# Patient Record
Sex: Female | Born: 1985
Health system: Southern US, Community
[De-identification: ages and names within clinical notes are randomized; demographics above are authoritative.]

## PROBLEM LIST (undated history)

## (undated) DIAGNOSIS — Z789 Other specified health status: Secondary | ICD-10-CM

## (undated) HISTORY — PX: EYE SURGERY: SHX253

---

## 2014-09-28 ENCOUNTER — Telehealth: Payer: Self-pay | Admitting: *Deleted

## 2014-09-28 NOTE — Telephone Encounter (Signed)
Attempted to contact patient to schedule a new patient IUD removal. Left message for patient to contact the office.

## 2014-10-08 NOTE — Telephone Encounter (Signed)
[place] and has had it in for 2 years. Patient states she doesn't care for it. Patient is requesting to have it removed. Scheduled appointment for 11-04-14 @ 9:30

## 2014-11-04 ENCOUNTER — Ambulatory Visit: Payer: Self-pay | Admitting: Obstetrics & Gynecology

## 2014-11-16 ENCOUNTER — Encounter: Payer: Self-pay | Admitting: Obstetrics & Gynecology

## 2014-11-25 ENCOUNTER — Encounter: Payer: Self-pay | Admitting: Obstetrics

## 2014-11-25 ENCOUNTER — Ambulatory Visit (INDEPENDENT_AMBULATORY_CARE_PROVIDER_SITE_OTHER): Payer: Medicaid Other | Admitting: Obstetrics

## 2014-11-25 VITALS — BP 110/76 | HR 69 | Temp 98.6°F | Wt 126.0 lb

## 2014-11-25 DIAGNOSIS — A499 Bacterial infection, unspecified: Secondary | ICD-10-CM

## 2014-11-25 DIAGNOSIS — Z30432 Encounter for removal of intrauterine contraceptive device: Secondary | ICD-10-CM

## 2014-11-25 DIAGNOSIS — N760 Acute vaginitis: Secondary | ICD-10-CM

## 2014-11-25 DIAGNOSIS — Z3202 Encounter for pregnancy test, result negative: Secondary | ICD-10-CM

## 2014-11-25 DIAGNOSIS — Z30011 Encounter for initial prescription of contraceptive pills: Secondary | ICD-10-CM

## 2014-11-25 DIAGNOSIS — B9689 Other specified bacterial agents as the cause of diseases classified elsewhere: Secondary | ICD-10-CM

## 2014-11-25 DIAGNOSIS — Z01818 Encounter for other preprocedural examination: Secondary | ICD-10-CM

## 2014-11-25 LAB — POCT URINE PREGNANCY: PREG TEST UR: NEGATIVE

## 2014-11-25 MED ORDER — NORETHINDRONE-ETH ESTRADIOL 1-35 MG-MCG PO TABS: 1.0000 | ORAL_TABLET | Freq: Every day | ORAL | Status: DC

## 2014-11-25 MED ORDER — METRONIDAZOLE 0.75 % VA GEL: 1.0000 | Freq: Two times a day (BID) | VAGINAL | Status: DC

## 2014-11-26 NOTE — Progress Notes (Signed)
Subjective:    Sherri Christensen is a 29 y.o. female who presents for IUD removal. The patient has no complaints today. The patient is sexually active. Pertinent past medical history: none.  The information documented in the HPI was reviewed and verified.  Menstrual History: OB History    Gravida Para Term Preterm AB TAB SAB Ectopic Multiple Living   3 3 3               Patient's last menstrual period was 11/18/2014.   Patient Active Problem List   Diagnosis Date Noted  . BV (bacterial vaginosis) 11/25/2014   History reviewed. No pertinent past medical history.  Past Surgical History  Procedure Laterality Date  . Mouth surgery    . Eye surgery      lasik    Current outpatient prescriptions: metroNIDAZOLE (METROGEL VAGINAL) 0.75 % vaginal gel, Place 1 Applicatorful vaginally 2 (two) times daily., Disp: 70 g, Rfl: 0;  norethindrone-ethinyl estradiol 1/35 (ORTHO-NOVUM 1/35, 28,) tablet, Take 1 tablet by mouth daily., Disp: 1 Package, Rfl: 11 Not on File  History  Substance Use Topics  . Smoking status: Never Smoker   . Smokeless tobacco: Not on file  . Alcohol Use: No    Family History  Problem Relation Age of Onset  . Hypertension Mother   . Hypertension Maternal Grandmother        Review of Systems Constitutional: negative for weight loss Genitourinary:negative for abnormal menstrual periods and vaginal discharge  Objective:   BP 110/76 mmHg  Pulse 69  Temp(Src) 98.6 F (37 C)  Wt 126 lb (57.153 kg)  LMP 11/18/2014                  Abdomen:  normal findings: no organomegaly, soft, non-tender and no hernia  Pelvis:  External genitalia: normal general appearance Urinary system: urethral meatus normal and bladder without fullness, nontender Vaginal: normal without tenderness, induration or masses Cervix: normal appearance.  IUD string visible and grasped with dressing forceps, and the IUD removed, intact. Adnexa: normal bimanual exam Uterus: anteverted and  non-tender, normal size    Assessment:    29 y.o., discontinuing IUD, wants to start OCP's,  no contraindications.    Plan:   Ortho Novum 1/35 Rx   All questions answered.  Meds ordered this encounter  Medications  . norethindrone-ethinyl estradiol 1/35 (ORTHO-NOVUM 1/35, 28,) tablet    Sig: Take 1 tablet by mouth daily.    Dispense:  1 Package    Refill:  11  . metroNIDAZOLE (METROGEL VAGINAL) 0.75 % vaginal gel    Sig: Place 1 Applicatorful vaginally 2 (two) times daily.    Dispense:  70 g    Refill:  0   Orders Placed This Encounter  Procedures  . SureSwab, Vaginosis/Vaginitis Plus  . POCT urine pregnancy

## 2014-11-29 LAB — SURESWAB, VAGINOSIS/VAGINITIS PLUS
Atopobium vaginae: DETECTED Log (cells/mL)
C. ALBICANS, DNA: NOT DETECTED
C. GLABRATA, DNA: NOT DETECTED
C. TROPICALIS, DNA: NOT DETECTED
C. parapsilosis, DNA: NOT DETECTED
C. trachomatis RNA, TMA: NOT DETECTED
GARDNERELLA VAGINALIS: 7 Log (cells/mL)
LACTOBACILLUS SPECIES: NOT DETECTED Log (cells/mL)
MEGASPHAERA SPECIES: 7.9 Log (cells/mL)
N. GONORRHOEAE RNA, TMA: NOT DETECTED
T. vaginalis RNA, QL TMA: NOT DETECTED

## 2014-11-30 ENCOUNTER — Telehealth: Payer: Self-pay | Admitting: Obstetrics

## 2014-11-30 ENCOUNTER — Other Ambulatory Visit: Payer: Self-pay | Admitting: Obstetrics

## 2014-12-01 NOTE — Telephone Encounter (Signed)
Not seen by physician. 

## 2015-05-27 ENCOUNTER — Other Ambulatory Visit: Payer: Medicaid Other

## 2015-06-02 ENCOUNTER — Other Ambulatory Visit (INDEPENDENT_AMBULATORY_CARE_PROVIDER_SITE_OTHER): Payer: Medicaid Other

## 2015-06-02 ENCOUNTER — Encounter: Payer: Self-pay | Admitting: Obstetrics

## 2015-06-02 VITALS — BP 116/75 | HR 85 | Temp 99.0°F | Ht 71.0 in | Wt 130.0 lb

## 2015-06-02 DIAGNOSIS — N926 Irregular menstruation, unspecified: Secondary | ICD-10-CM | POA: Diagnosis not present

## 2015-06-02 LAB — POCT URINE PREGNANCY: Preg Test, Ur: POSITIVE — AB

## 2015-06-02 NOTE — Progress Notes (Signed)
Patient in office for a confirmation of pregnancy. Patient states she had a positive home pregnancy test. Pregnancy Test in office is positive. Patient encourage to take prenatal vitamins. Patient also encouraged to schedule a prenatal vitamins.   BP 116/75 mmHg  Pulse 85  Temp(Src) 99 F (37.2 C)  Ht 5\' 11"  (1.803 m)  Wt 130 lb (58.968 kg)  BMI 18.14 kg/m2  LMP 04/30/2015

## 2015-06-07 ENCOUNTER — Ambulatory Visit (INDEPENDENT_AMBULATORY_CARE_PROVIDER_SITE_OTHER): Payer: Medicaid Other | Admitting: Obstetrics

## 2015-06-07 ENCOUNTER — Encounter: Payer: Self-pay | Admitting: Obstetrics

## 2015-06-07 VITALS — BP 114/71 | HR 77 | Temp 99.2°F | Ht 71.0 in | Wt 130.0 lb

## 2015-06-07 DIAGNOSIS — O200 Threatened abortion: Secondary | ICD-10-CM | POA: Diagnosis not present

## 2015-06-07 NOTE — Addendum Note (Signed)
Addended by: Aishah Teffeteller D on: 06/07/2015 04:09 PM   Modules accepted: Orders  

## 2015-06-07 NOTE — Progress Notes (Signed)
Patient ID: Sherri Christensen, female   DOB: 1986/04/12, 29 y.o.   MRN: [id number]  Chief Complaint  Patient presents with  . Problem    Positive UPT 06/02/15. Vaginal Bleeding for 3 days and some cramping.    HPI Sherri Christensen is a 29 y.o. female.  Vaginal bleeding for past few days, light.  No cramping.  HPI  History reviewed. No pertinent past medical history.  Past Surgical History  Procedure Laterality Date  . Mouth surgery    . Eye surgery      lasik    Family History  Problem Relation Age of Onset  . Hypertension Mother   . Hypertension Maternal Grandmother     Social History History  Substance Use Topics  . Smoking status: Never Smoker   . Smokeless tobacco: Not on file  . Alcohol Use: No    No Known Allergies  No current outpatient prescriptions on file.   No current facility-administered medications for this visit.    Review of Systems Review of Systems Constitutional: negative for fatigue and weight loss Respiratory: negative for cough and wheezing Cardiovascular: negative for chest pain, fatigue and palpitations Gastrointestinal: negative for abdominal pain and change in bowel habits Genitourinary: vaginal bleeding Integument/breast: negative for nipple discharge Musculoskeletal:negative for myalgias Neurological: negative for gait problems and tremors Behavioral/Psych: negative for abusive relationship, depression Endocrine: negative for temperature intolerance     Blood pressure 114/71, pulse 77, temperature 99.2 F (37.3 C), height 5\' 11"  (1.803 m), weight 130 lb (58.968 kg), last menstrual period 04/30/2015.  Physical Exam Physical Exam General:   alert  Skin:   no rash or abnormalities  Lungs:   clear to auscultation bilaterally  Heart:   regular rate and rhythm, S1, S2 normal, no murmur, click, rub or gallop  Breasts:   normal without suspicious masses, skin or nipple changes or axillary nodes  Abdomen:  normal findings: no organomegaly, soft,  non-tender and no hernia  Pelvis:  External genitalia: normal general appearance Urinary system: urethral meatus normal and bladder without fullness, nontender Vaginal: normal without tenderness, induration or masses Cervix: normal appearance Adnexa: normal bimanual exam Uterus: anteverted and non-tender, normal size      Data Reviewed labs  Assessment     Threatened Abortion    Plan    Follow clinically.  NOB visit scheduled in 1 week.  No orders of the defined types were placed in this encounter.   No orders of the defined types were placed in this encounter.

## 2015-06-10 ENCOUNTER — Other Ambulatory Visit: Payer: Self-pay | Admitting: Obstetrics

## 2015-06-10 DIAGNOSIS — B373 Candidiasis of vulva and vagina: Secondary | ICD-10-CM

## 2015-06-10 DIAGNOSIS — N760 Acute vaginitis: Principal | ICD-10-CM

## 2015-06-10 DIAGNOSIS — B9689 Other specified bacterial agents as the cause of diseases classified elsewhere: Secondary | ICD-10-CM

## 2015-06-10 DIAGNOSIS — B3731 Acute candidiasis of vulva and vagina: Secondary | ICD-10-CM

## 2015-06-10 LAB — SURESWAB, VAGINOSIS/VAGINITIS PLUS
ATOPOBIUM VAGINAE: 7 Log (cells/mL)
C. PARAPSILOSIS, DNA: NOT DETECTED
C. TRACHOMATIS RNA, TMA: NOT DETECTED
C. TROPICALIS, DNA: NOT DETECTED
C. albicans, DNA: DETECTED — AB
C. glabrata, DNA: NOT DETECTED
Gardnerella vaginalis: >8 Log (cells/mL)
LACTOBACILLUS SPECIES: NOT DETECTED Log (cells/mL)
MEGASPHAERA SPECIES: >8 Log (cells/mL)
N. GONORRHOEAE RNA, TMA: NOT DETECTED
T. vaginalis RNA, QL TMA: NOT DETECTED

## 2015-06-10 MED ORDER — METRONIDAZOLE 500 MG PO TABS: 500.0000 mg | ORAL_TABLET | Freq: Two times a day (BID) | ORAL | Status: DC

## 2015-06-10 MED ORDER — FLUCONAZOLE 150 MG PO TABS: 150.0000 mg | ORAL_TABLET | Freq: Once | ORAL | Status: DC

## 2015-06-18 ENCOUNTER — Inpatient Hospital Stay (HOSPITAL_COMMUNITY)
Admission: AD | Admit: 2015-06-18 | Discharge: 2015-06-18 | Disposition: A | Discharge status: Home / Self Care | Payer: Medicaid Other | Source: Ambulatory Visit | Attending: Obstetrics | Admitting: Obstetrics

## 2015-06-18 ENCOUNTER — Inpatient Hospital Stay (HOSPITAL_COMMUNITY): Payer: Medicaid Other

## 2015-06-18 ENCOUNTER — Encounter (HOSPITAL_COMMUNITY): Payer: Self-pay | Admitting: *Deleted

## 2015-06-18 DIAGNOSIS — O468X1 Other antepartum hemorrhage, first trimester: Secondary | ICD-10-CM

## 2015-06-18 DIAGNOSIS — N760 Acute vaginitis: Secondary | ICD-10-CM | POA: Diagnosis not present

## 2015-06-18 DIAGNOSIS — R42 Dizziness and giddiness: Secondary | ICD-10-CM | POA: Insufficient documentation

## 2015-06-18 DIAGNOSIS — O23591 Infection of other part of genital tract in pregnancy, first trimester: Secondary | ICD-10-CM | POA: Diagnosis not present

## 2015-06-18 DIAGNOSIS — B9689 Other specified bacterial agents as the cause of diseases classified elsewhere: Secondary | ICD-10-CM | POA: Insufficient documentation

## 2015-06-18 DIAGNOSIS — O208 Other hemorrhage in early pregnancy: Secondary | ICD-10-CM | POA: Insufficient documentation

## 2015-06-18 DIAGNOSIS — O209 Hemorrhage in early pregnancy, unspecified: Secondary | ICD-10-CM | POA: Diagnosis present

## 2015-06-18 DIAGNOSIS — R1032 Left lower quadrant pain: Secondary | ICD-10-CM | POA: Diagnosis not present

## 2015-06-18 DIAGNOSIS — Z3A01 Less than 8 weeks gestation of pregnancy: Secondary | ICD-10-CM | POA: Diagnosis not present

## 2015-06-18 DIAGNOSIS — O418X10 Other specified disorders of amniotic fluid and membranes, first trimester, not applicable or unspecified: Secondary | ICD-10-CM

## 2015-06-18 DIAGNOSIS — O4691 Antepartum hemorrhage, unspecified, first trimester: Secondary | ICD-10-CM

## 2015-06-18 LAB — WET PREP, GENITAL
TRICH WET PREP: NONE SEEN
YEAST WET PREP: NONE SEEN

## 2015-06-18 LAB — CBC
HEMATOCRIT: 35.3 % — AB (ref 36.0–46.0)
HEMOGLOBIN: 12 g/dL (ref 12.0–15.0)
MCH: 26.2 pg (ref 26.0–34.0)
MCHC: 34 g/dL (ref 30.0–36.0)
MCV: 77.1 fL — ABNORMAL LOW (ref 78.0–100.0)
Platelets: 179 10*3/uL (ref 150–400)
RBC: 4.58 MIL/uL (ref 3.87–5.11)
RDW: 13.4 % (ref 11.5–15.5)
WBC: 11.7 10*3/uL — ABNORMAL HIGH (ref 4.0–10.5)

## 2015-06-18 LAB — URINE MICROSCOPIC-ADD ON

## 2015-06-18 LAB — URINALYSIS, ROUTINE W REFLEX MICROSCOPIC
BILIRUBIN URINE: NEGATIVE
Glucose, UA: NEGATIVE mg/dL
KETONES UR: NEGATIVE mg/dL
Leukocytes, UA: NEGATIVE
Nitrite: NEGATIVE
PROTEIN: NEGATIVE mg/dL
Specific Gravity, Urine: 1.025 (ref 1.005–1.030)
UROBILINOGEN UA: 0.2 mg/dL (ref 0.0–1.0)
pH: 5.5 (ref 5.0–8.0)

## 2015-06-18 LAB — ABO/RH: ABO/RH(D): O POS

## 2015-06-18 LAB — HCG, QUANTITATIVE, PREGNANCY: hCG, Beta Chain, Quant, S: 107701 m[IU]/mL — ABNORMAL HIGH (ref <5)

## 2015-06-18 MED ORDER — METRONIDAZOLE 500 MG PO TABS: 500.0000 mg | ORAL_TABLET | Freq: Two times a day (BID) | ORAL | Status: DC

## 2015-06-18 NOTE — MAU Note (Signed)
Pt presents to MAU with complaints of heavy vaginal bleeding, dizziness,and lower abdominal cramping

## 2015-06-18 NOTE — Discharge Instructions (Signed)
Bacterial Vaginosis °Bacterial vaginosis is a vaginal infection that occurs when the normal balance of bacteria in the vagina is disrupted. It results from an overgrowth of certain bacteria. This is the most common vaginal infection in women of childbearing age. Treatment is important to prevent complications, especially in pregnant women, as it can cause a premature delivery. °CAUSES  °Bacterial vaginosis is caused by an increase in harmful bacteria that are normally present in smaller amounts in the vagina. Several different kinds of bacteria can cause bacterial vaginosis. However, the reason that the condition develops is not fully understood. °RISK FACTORS °Certain activities or behaviors can put you at an increased risk of developing bacterial vaginosis, including: °· Having a new sex partner or multiple sex partners. °· Douching. °· Using an intrauterine device (IUD) for contraception. °Women do not get bacterial vaginosis from toilet seats, bedding, swimming pools, or contact with objects around them. °SIGNS AND SYMPTOMS  °Some women with bacterial vaginosis have no signs or symptoms. Common symptoms include: °· Grey vaginal discharge. °· A fishlike odor with discharge, especially after sexual intercourse. °· Itching or burning of the vagina and vulva. °· Burning or pain with urination. °DIAGNOSIS  °Your health care provider will take a medical history and examine the vagina for signs of bacterial vaginosis. A sample of vaginal fluid may be taken. Your health care provider will look at this sample under a microscope to check for bacteria and abnormal cells. A vaginal pH test may also be done.  °TREATMENT  °Bacterial vaginosis may be treated with antibiotic medicines. These may be given in the form of a pill or a vaginal cream. A second round of antibiotics may be prescribed if the condition comes back after treatment.  °HOME CARE INSTRUCTIONS  °· Only take over-the-counter or prescription medicines as  directed by your health care provider. °· If antibiotic medicine was prescribed, take it as directed. Make sure you finish it even if you start to feel better. °· Do not have sex until treatment is completed. °· Tell all sexual partners that you have a vaginal infection. They should see their health care provider and be treated if they have problems, such as a mild rash or itching. °· Practice safe sex by using condoms and only having one sex partner. °SEEK MEDICAL CARE IF:  °· Your symptoms are not improving after 3 days of treatment. °· You have increased discharge or pain. °· You have a fever. °MAKE SURE YOU:  °· Understand these instructions. °· Will watch your condition. °· Will get help right away if you are not doing well or get worse. °FOR MORE INFORMATION  °Centers for Disease Control and Prevention, Division of STD Prevention: www.cdc.gov/std °American Sexual Health Association (ASHA): www.ashastd.org  °Document Released: 11/05/2005 Document Revised: 08/26/2013 Document Reviewed: 06/17/2013 °ExitCare® Patient Information ©2015 [employer]. This information is not intended to replace advice given to you by your health care provider. Make sure you discuss any questions you have with your health care provider. ° °Subchorionic Hematoma °A subchorionic hematoma is a gathering of blood between the outer wall of the placenta and the inner wall of the womb (uterus). The placenta is the organ that connects the fetus to the wall of the uterus. The placenta performs the feeding, breathing (oxygen to the fetus), and waste removal (excretory work) of the fetus.  °Subchorionic hematoma is the most common abnormality found on a result from ultrasonography done during the first trimester or early second trimester of pregnancy. If   there has been little or no vaginal bleeding, early small hematomas usually shrink on their own and do not affect your baby or pregnancy. The blood is gradually absorbed over 1-2 weeks. When  bleeding starts later in pregnancy or the hematoma is larger or occurs in an older pregnant woman, the outcome may not be as good. Larger hematomas may get bigger, which increases the chances for miscarriage. Subchorionic hematoma also increases the risk of premature detachment of the placenta from the uterus, preterm (premature) labor, and stillbirth. °HOME CARE INSTRUCTIONS °· Stay on bed rest if your health care provider recommends this. Although bed rest will not prevent more bleeding or prevent a miscarriage, your health care provider may recommend bed rest until you are advised otherwise. °· Avoid heavy lifting (more than 10 lb [4.5 kg]), exercise, sexual intercourse, or douching as directed by your health care provider. °· Keep track of the number of pads you use each day and how soaked (saturated) they are. Write down this information. °· Do not use tampons. °· Keep all follow-up appointments as directed by your health care provider. Your health care provider may ask you to have follow-up blood tests or ultrasound tests or both. °SEEK IMMEDIATE MEDICAL CARE IF: °· You have severe cramps in your stomach, back, abdomen, or pelvis. °· You have a fever. °· You pass large clots or tissue. Save any tissue for your health care provider to look at. °· Your bleeding increases or you become lightheaded, feel weak, or have fainting episodes. °Document Released: 02/20/2007 Document Revised: 03/22/2014 Document Reviewed: 06/04/2013 °ExitCare® Patient Information ©2015 [employer]. This information is not intended to replace advice given to you by your health care provider. Make sure you discuss any questions you have with your health care provider. ° °

## 2015-06-18 NOTE — MAU Provider Note (Signed)
History     CSN: [id number]  Arrival date and time: 06/18/15 1355   First Provider Initiated Contact with Patient 06/18/15 1526      Chief Complaint  Patient presents with  . Dizziness  . Vaginal Bleeding  . Abdominal Cramping   HPI Ms. Sherri Christensen is a 29 y.o. G4P3000 at [redacted]w[redacted]d who presents to MAU today with complaint of vaginal bleeding and lower abdominal pain. The patient states that bleeding was heavier earlier, but upon arrival in MAU she states there was no blood noted with wiping. She denies vaginal discharge, N/V/D, UTI symptoms or fever. She states LLQ abdominal pain is mild and intermittent. She states that bleeding started today. She also endorses dizziness. LMP was 04/30/15.   OB History    Gravida Para Term Preterm AB TAB SAB Ectopic Multiple Living   4 3 3              History reviewed. No pertinent past medical history.  Past Surgical History  Procedure Laterality Date  . Mouth surgery    . Eye surgery      lasik    Family History  Problem Relation Age of Onset  . Hypertension Mother   . Hypertension Maternal Grandmother     History  Substance Use Topics  . Smoking status: Never Smoker   . Smokeless tobacco: Not on file  . Alcohol Use: No    Allergies: No Known Allergies  Prescriptions prior to admission  Medication Sig Dispense Refill Last Dose  . Prenatal Vit-Min-FA-Fish Oil ([facility] PRENATAL GUMMY PO) Take 2 each by mouth daily.   06/17/2015 at Unknown time  . fluconazole (DIFLUCAN) 150 MG tablet Take 1 tablet (150 mg total) by mouth once. (Patient not taking: Reported on 06/18/2015) 1 tablet 2   . metroNIDAZOLE (FLAGYL) 500 MG tablet Take 1 tablet (500 mg total) by mouth 2 (two) times daily. (Patient not taking: Reported on 06/18/2015) 14 tablet 2     Review of Systems  Constitutional: Negative for fever and malaise/fatigue.  Gastrointestinal: Positive for abdominal pain. Negative for nausea, vomiting, diarrhea and constipation.  Genitourinary:  Negative for dysuria, urgency and frequency.       + vaginal bleeding Neg - vaginal discharge   Physical Exam   Blood pressure 110/65, pulse 72, temperature 98.2 F (36.8 C), resp. rate 18, weight 130 lb (58.968 kg), last menstrual period 04/30/2015.  Physical Exam  Nursing note and vitals reviewed. Constitutional: She is oriented to person, place, and time. She appears well-developed and well-nourished. No distress.  HENT:  Head: Normocephalic and atraumatic.  Cardiovascular: Normal rate.   Respiratory: Effort normal.  GI: Soft. She exhibits no distension and no mass. There is tenderness (very mild LLQ abdominal tenderness to palpation). There is no rebound and no guarding.  Neurological: She is alert and oriented to person, place, and time.  Skin: Skin is warm and dry. No erythema.  Psychiatric: She has a normal mood and affect.    Results for orders placed or performed during the hospital encounter of 06/18/15 (from the past 24 hour(s))  CBC     Status: Abnormal   Collection Time: 06/18/15  2:15 PM  Result Value Ref Range   WBC 11.7 (H) 4.0 - 10.5 K/uL   RBC 4.58 3.87 - 5.11 MIL/uL   Hemoglobin 12.0 12.0 - 15.0 g/dL   HCT 40.9 (L) 81.1 - 91.4 %   MCV 77.1 (L) 78.0 - 100.0 fL   MCH 26.2 26.0 - 34.0  pg   MCHC 34.0 30.0 - 36.0 g/dL   RDW 09.8 11.9 - 14.7 %   Platelets 179 150 - 400 K/uL  ABO/Rh     Status: None (Preliminary result)   Collection Time: 06/18/15  2:15 PM  Result Value Ref Range   ABO/RH(D) O POS   hCG, quantitative, pregnancy     Status: Abnormal   Collection Time: 06/18/15  2:15 PM  Result Value Ref Range   hCG, Beta Chain, Quant, S 107701 (H) <5 mIU/mL  Urinalysis, Routine w reflex microscopic (not at [facility])     Status: Abnormal   Collection Time: 06/18/15  3:00 PM  Result Value Ref Range   Color, Urine YELLOW YELLOW   APPearance CLEAR CLEAR   Specific Gravity, Urine 1.025 1.005 - 1.030   pH 5.5 5.0 - 8.0   Glucose, UA NEGATIVE NEGATIVE mg/dL   Hgb  urine dipstick TRACE (A) NEGATIVE   Bilirubin Urine NEGATIVE NEGATIVE   Ketones, ur NEGATIVE NEGATIVE mg/dL   Protein, ur NEGATIVE NEGATIVE mg/dL   Urobilinogen, UA 0.2 0.0 - 1.0 mg/dL   Nitrite NEGATIVE NEGATIVE   Leukocytes, UA NEGATIVE NEGATIVE  Urine microscopic-add on     Status: Abnormal   Collection Time: 06/18/15  3:00 PM  Result Value Ref Range   Squamous Epithelial / LPF RARE RARE   WBC, UA 0-2 <3 WBC/hpf   RBC / HPF 0-2 <3 RBC/hpf   Bacteria, UA FEW (A) RARE   Urine-Other MUCOUS PRESENT   Wet prep, genital     Status: Abnormal   Collection Time: 06/18/15  4:15 PM  Result Value Ref Range   Yeast Wet Prep HPF POC NONE SEEN NONE SEEN   Trich, Wet Prep NONE SEEN NONE SEEN   Clue Cells Wet Prep HPF POC MODERATE (A) NONE SEEN   WBC, Wet Prep HPF POC MANY (A) NONE SEEN   US Ob Comp Less 14 Wks  06/18/2015   CLINICAL DATA:  Vaginal bleeding  EXAM: OBSTETRIC <14 WK [place] AND TRANSVAGINAL OB US  TECHNIQUE: Both transabdominal and transvaginal ultrasound examinations were performed for complete evaluation of the gestation as well as the maternal uterus, adnexal regions, and pelvic cul-de-sac. Transvaginal technique was performed to assess early pregnancy.  COMPARISON:  None.  FINDINGS: Intrauterine gestational sac: Visualized/normal in shape.  Yolk sac:  Visualized  Embryo:  Visualized  Cardiac Activity: Visualized  Heart Rate: 144  bpm  MSD:   mm    w     d  CRL:  10.8  mm   7 w   1 d                  [place] EDC: 02/03/2016  Maternal uterus/adnexae: Small subchorionic hemorrhage. No adnexal masses. Trace free fluid in the pelvis.  IMPRESSION: Seven week 1 day intrauterine pregnancy. No acute maternal findings.   Electronically Signed   By: Charlett Nose M.D.   On: 06/18/2015 16:11   US Ob Transvaginal  06/18/2015   CLINICAL DATA:  Vaginal bleeding  EXAM: OBSTETRIC <14 WK [place] AND TRANSVAGINAL OB US  TECHNIQUE: Both transabdominal and transvaginal ultrasound examinations were performed for complete  evaluation of the gestation as well as the maternal uterus, adnexal regions, and pelvic cul-de-sac. Transvaginal technique was performed to assess early pregnancy.  COMPARISON:  None.  FINDINGS: Intrauterine gestational sac: Visualized/normal in shape.  Yolk sac:  Visualized  Embryo:  Visualized  Cardiac Activity: Visualized  Heart Rate: 144  bpm  MSD:   mm    w     d  CRL:  10.8  mm   7 w   1 d                  US EDC: 03/17/20[place]  Maternal uterus/adnexae: Small subchorionic hemorrhage. No adnexal masses. Trace free fluid in the pelvis.  IMPRESSION: Seven week 1 day intrauterine pregnancy. No acute maternal findings.   Electronically Signed   By: Charlett Nose M.D.   On: 06/18/2015 16:11    MAU Course  Procedures None  MDM +UPT UA, wet prep, GC/chlamydia, CBC, ABO/Rh, quant hCG, HIV, RPR and [place] today to rule out ectopic pregnancy  Assessment and Plan  A: SIUP at [redacted]w[redacted]d Small subchorionic hemorrhage Bacterial vaginosis  P: Discharge home Rx for Flagyl given to patient  Bleeding precautions discussed Pelvic rest advised Patient advised to follow-up with Dr. Clearance Coots as scheduled for routine prenatal care Patient may return to MAU as needed or if her condition were to change or worsen   Marny Lowenstein, PA-C  06/18/2015, 4:43 PM

## 2015-06-19 LAB — HIV ANTIBODY (ROUTINE TESTING W REFLEX): HIV Screen 4th Generation wRfx: NONREACTIVE

## 2015-06-20 LAB — GC/CHLAMYDIA PROBE AMP (~~LOC~~) NOT AT ARMC
CHLAMYDIA, DNA PROBE: NEGATIVE
NEISSERIA GONORRHEA: NEGATIVE

## 2015-07-01 ENCOUNTER — Inpatient Hospital Stay (HOSPITAL_COMMUNITY)
Admission: AD | Admit: 2015-07-01 | Discharge: 2015-07-01 | Disposition: A | Discharge status: Home / Self Care | Payer: Medicaid Other | Source: Ambulatory Visit | Attending: Obstetrics | Admitting: Obstetrics

## 2015-07-01 ENCOUNTER — Inpatient Hospital Stay (HOSPITAL_COMMUNITY): Payer: Medicaid Other

## 2015-07-01 ENCOUNTER — Encounter (HOSPITAL_COMMUNITY): Payer: Self-pay | Admitting: *Deleted

## 2015-07-01 DIAGNOSIS — O200 Threatened abortion: Secondary | ICD-10-CM | POA: Insufficient documentation

## 2015-07-01 DIAGNOSIS — O209 Hemorrhage in early pregnancy, unspecified: Secondary | ICD-10-CM | POA: Diagnosis not present

## 2015-07-01 DIAGNOSIS — O468X1 Other antepartum hemorrhage, first trimester: Secondary | ICD-10-CM | POA: Diagnosis not present

## 2015-07-01 DIAGNOSIS — O418X10 Other specified disorders of amniotic fluid and membranes, first trimester, not applicable or unspecified: Secondary | ICD-10-CM

## 2015-07-01 HISTORY — DX: Other specified health status: Z78.9

## 2015-07-01 LAB — CBC
HCT: 32.6 % — ABNORMAL LOW (ref 36.0–46.0)
HEMOGLOBIN: 11 g/dL — AB (ref 12.0–15.0)
MCH: 25.9 pg — ABNORMAL LOW (ref 26.0–34.0)
MCHC: 33.7 g/dL (ref 30.0–36.0)
MCV: 76.9 fL — ABNORMAL LOW (ref 78.0–100.0)
Platelets: 166 10*3/uL (ref 150–400)
RBC: 4.24 MIL/uL (ref 3.87–5.11)
RDW: 13.6 % (ref 11.5–15.5)
WBC: 9.4 10*3/uL (ref 4.0–10.5)

## 2015-07-01 NOTE — MAU Note (Signed)
Started bleeding prior to arrival, when arrived, had soaked pad and bled through clothes.  Passed a large clot while in bathroom.  Denies any pain.  Prior episode of bleeding was just spotting

## 2015-07-01 NOTE — MAU Note (Signed)
Assumed care of patient.

## 2015-07-01 NOTE — MAU Provider Note (Signed)
History     CSN: [id number]  Arrival date and time: 07/01/15 1457   First Provider Initiated Contact with Patient 07/01/15 1536      Chief Complaint  Patient presents with  . Vaginal Bleeding   HPI Sherri Christensen 29 y.o.   presents to MAU complaining of vaginal bleeding.  At 2:30, she felt like she peed on herself and she went to bathroom.  She notes her clothes were soaked.  She continues to note bleeding now. It is dark red.  She has abdominal pain, 2/10.  She had nausea earlier today but none today.  She denies weakness, syncope, fever, dysuria.   OB History    Gravida Para Term Preterm AB TAB SAB Ectopic Multiple Living   Past Medical History  Diagnosis Date  . Medical history non-contributory     Past Surgical History  Procedure Laterality Date  . Eye surgery      lasik    Family History  Problem Relation Age of Onset  . Hypertension Mother   . Hypertension Maternal Grandmother     Social History  Substance Use Topics  . Smoking status: Never Smoker   . Smokeless tobacco: None  . Alcohol Use: No    Allergies: No Known Allergies  Prescriptions prior to admission  Medication Sig Dispense Refill Last Dose  . Prenatal Vit-Min-FA-Fish Oil ([facility] PRENATAL GUMMY PO) Take 2 each by mouth daily.   07/01/2015 at Unknown time  . metroNIDAZOLE (FLAGYL) 500 MG tablet Take 1 tablet (500 mg total) by mouth 2 (two) times daily. 14 tablet 0 Unknown at Unknown time    ROS Pertinent ROS in HPI.  All other systems are negative.   Physical Exam   Blood pressure 109/74, pulse 78, temperature 98.8 F (37.1 C), temperature source Oral, resp. rate 18, last menstrual period 04/30/2015.  Physical Exam  Constitutional: She is oriented to person, place, and time. She appears well-developed and well-nourished. No distress.  HENT:  Head: Normocephalic and atraumatic.  Eyes: EOM are normal.  Neck: Normal range of motion.  Cardiovascular: Normal rate.    Respiratory: Effort normal. No respiratory distress.  GI: Soft. There is no tenderness.  Genitourinary:  Mod amt of thin red blood.  No visible clots or products of conception noted.  Cervix is closed.   No CMT.  No adnexal mass or tenderness  Musculoskeletal: Normal range of motion.  Neurological: She is alert and oriented to person, place, and time.  Skin: Skin is warm and dry.  Psychiatric: She has a normal mood and affect.   Results for orders placed or performed during the hospital encounter of 07/01/15 (from the past 24 hour(s))  CBC     Status: Abnormal   Collection Time: 07/01/15  3:54 PM  Result Value Ref Range   WBC 9.4 4.0 - 10.5 K/uL   RBC 4.24 3.87 - 5.11 MIL/uL   Hemoglobin 11.0 (L) 12.0 - 15.0 g/dL   HCT 78.2 (L) 95.6 - 21.3 %   MCV 76.9 (L) 78.0 - 100.0 fL   MCH 25.9 (L) 26.0 - 34.0 pg   MCHC 33.7 30.0 - 36.0 g/dL   RDW 08.6 57.8 - 46.9 %   Platelets 166 150 - 400 K/uL   US Ob Transvaginal  07/01/2015   CLINICAL DATA:  Pregnant, vaginal bleeding/clots  EXAM: TRANSVAGINAL OB ULTRASOUND  TECHNIQUE: Transvaginal ultrasound was performed for complete evaluation of  the gestation as well as the maternal uterus, adnexal regions, and pelvic cul-de-sac.  COMPARISON:  06/18/2015  FINDINGS: Intrauterine gestational sac: Visualized/normal in shape.  Yolk sac:  Present  Embryo:  Present  Cardiac Activity: Present  Heart Rate: 160 bpm  CRL:   19.8  mm   8 w 4 d                  [place] EDC: 02/06/2015  Maternal uterus/adnexae: Large subchronic hemorrhage, measuring 7.9 x 3.6 x 4.8 cm.  Bilateral ovaries are not discretely visualized.  No free fluid.  IMPRESSION: Single live intrauterine gestation with estimated gestational age measuring 8 weeks 4 days by crown lump length.  Large subchronic hemorrhage.   Electronically Signed   By: Charline Bills M.D.   On: 07/01/2015 16:30   MAU Course  Procedures  MDM Discussed with Dr. Clearance Coots.  Pt may be discharged to home on pelvic  rest.  Assessment and Plan  A:  1. Subchorionic hemorrhage in first trimester   2. Vaginal bleeding in pregnancy, first trimester   3. Threatened abortion in early pregnancy    P: Discharge to home Continue PNV qd Pelvic rest F/u in office Precautions discussed Patient may return to MAU as needed or if her condition were to change or worsen   Bertram Denver 07/01/2015, 3:37 PM

## 2015-07-01 NOTE — Discharge Instructions (Signed)
Pelvic Rest Pelvic rest is sometimes recommended for women when:   The placenta is partially or completely covering the opening of the cervix (placenta previa).  There is bleeding between the uterine wall and the amniotic sac in the first trimester (subchorionic hemorrhage).  The cervix begins to open without labor starting (incompetent cervix, cervical insufficiency).  The labor is too early (preterm labor). HOME CARE INSTRUCTIONS  Do not have sexual intercourse, stimulation, or an orgasm.  Do not use tampons, douche, or put anything in the vagina.  Do not lift anything over 10 pounds (4.5 kg).  Avoid strenuous activity or straining your pelvic muscles. SEEK MEDICAL CARE IF:  You have any vaginal bleeding during pregnancy. Treat this as a potential emergency.  You have cramping pain felt low in the stomach (stronger than menstrual cramps).  You notice vaginal discharge (watery, mucus, or bloody).  You have a low, dull backache.  There are regular contractions or uterine tightening. SEEK IMMEDIATE MEDICAL CARE IF: You have vaginal bleeding and have placenta previa.  Document Released: 03/02/2011 Document Revised: 01/28/2012 Document Reviewed: 03/02/2011 [facility] Patient Information 2015 [place], [place]. This information is not intended to replace advice given to you by your health care provider. Make sure you discuss any questions you have with your health care provider.  Subchorionic Hematoma A subchorionic hematoma is a gathering of blood between the outer wall of the placenta and the inner wall of the womb (uterus). The placenta is the organ that connects the fetus to the wall of the uterus. The placenta performs the feeding, breathing (oxygen to the fetus), and waste removal (excretory work) of the fetus.  Subchorionic hematoma is the most common abnormality found on a result from ultrasonography done during the first trimester or early second trimester of pregnancy. If  there has been little or no vaginal bleeding, early small hematomas usually shrink on their own and do not affect your baby or pregnancy. The blood is gradually absorbed over 1-2 weeks. When bleeding starts later in pregnancy or the hematoma is larger or occurs in an older pregnant woman, the outcome may not be as good. Larger hematomas may get bigger, which increases the chances for miscarriage. Subchorionic hematoma also increases the risk of premature detachment of the placenta from the uterus, preterm (premature) labor, and stillbirth. HOME CARE INSTRUCTIONS  Stay on bed rest if your health care provider recommends this. Although bed rest will not prevent more bleeding or prevent a miscarriage, your health care provider may recommend bed rest until you are advised otherwise.  Avoid heavy lifting (more than 10 lb [4.5 kg]), exercise, sexual intercourse, or douching as directed by your health care provider.  Keep track of the number of pads you use each day and how soaked (saturated) they are. Write down this information.  Do not use tampons.  Keep all follow-up appointments as directed by your health care provider. Your health care provider may ask you to have follow-up blood tests or ultrasound tests or both. SEEK IMMEDIATE MEDICAL CARE IF:  You have severe cramps in your stomach, back, abdomen, or pelvis.  You have a fever.  You pass large clots or tissue. Save any tissue for your health care provider to look at.  Your bleeding increases or you become lightheaded, feel weak, or have fainting episodes. Document Released: 02/20/2007 Document Revised: 03/22/2014 Document Reviewed: 06/04/2013 [facility] Patient Information 2015 [place], [place]. This information is not intended to replace advice given to you by your health care provider.  Make sure you discuss any questions you have with your health care provider.  Threatened Miscarriage A threatened miscarriage is when you have vaginal  bleeding during your first 20 weeks of pregnancy but the pregnancy has not ended. Your doctor will do tests to make sure you are still pregnant. The cause of the bleeding may not be known. This condition does not mean your pregnancy will end. It does increase the risk of it ending (complete miscarriage). HOME CARE   Make sure you keep all your doctor visits for prenatal care.  Get plenty of rest.  Do not have sex or use tampons if you have vaginal bleeding.  Do not douche.  Do not smoke or use drugs.  Do not drink alcohol.  Avoid caffeine. GET HELP IF:  You have light bleeding from your vagina.  You have belly pain or cramping.  You have a fever. GET HELP RIGHT AWAY IF:   You have heavy bleeding from your vagina.  You have clots of blood coming from your vagina.  You have bad pain or cramps in your low back or belly.  You have fever, chills, and bad belly pain. MAKE SURE YOU:   Understand these instructions.  Will watch your condition.  Will get help right away if you are not doing well or get worse. Document Released: 10/18/2008 Document Revised: 11/10/2013 Document Reviewed: 09/01/2013 [facility] Patient Information 2015 [place], [place]. This information is not intended to replace advice given to you by your health care provider. Make sure you discuss any questions you have with your health care provider.

## 2015-07-05 ENCOUNTER — Encounter: Payer: Medicaid Other | Admitting: Obstetrics

## 2015-07-14 ENCOUNTER — Ambulatory Visit (INDEPENDENT_AMBULATORY_CARE_PROVIDER_SITE_OTHER): Payer: Medicaid Other | Admitting: Obstetrics

## 2015-07-14 ENCOUNTER — Encounter: Payer: Self-pay | Admitting: Obstetrics

## 2015-07-14 VITALS — BP 103/65 | HR 84 | Temp 98.7°F | Wt 131.0 lb

## 2015-07-14 DIAGNOSIS — O2690 Pregnancy related conditions, unspecified, unspecified trimester: Secondary | ICD-10-CM | POA: Diagnosis not present

## 2015-07-14 DIAGNOSIS — Z3481 Encounter for supervision of other normal pregnancy, first trimester: Secondary | ICD-10-CM | POA: Diagnosis not present

## 2015-07-14 LAB — POCT URINALYSIS DIPSTICK
BILIRUBIN UA: NEGATIVE
Blood, UA: NEGATIVE
Glucose, UA: NEGATIVE
KETONES UA: NEGATIVE
Leukocytes, UA: NEGATIVE
Nitrite, UA: NEGATIVE
PH UA: 5
Protein, UA: NEGATIVE
SPEC GRAV UA: 1.01
Urobilinogen, UA: NEGATIVE

## 2015-07-14 NOTE — Progress Notes (Signed)
Subjective:    Sherri Christensen is being seen today for her first obstetrical visit.  This is not a planned pregnancy. She is at [redacted]w[redacted]d gestation. Her obstetrical history is significant for n/a. Relationship with FOB: significant other, living together. Patient does intend to breast feed. Pregnancy history fully reviewed.  The information documented in the HPI was reviewed and verified.  Menstrual History: OB History    Gravida Para Term Preterm AB TAB SAB Ectopic Multiple Living   0 0 0 0 0 0 3       Patient's last menstrual period was 04/30/2015.    Past Medical History  Diagnosis Date  . Medical history non-contributory     Past Surgical History  Procedure Laterality Date  . Eye surgery      lasik     (Not in a hospital admission) No Known Allergies  Social History  Substance Use Topics  . Smoking status: Never Smoker   . Smokeless tobacco: Never Used  . Alcohol Use: No    Family History  Problem Relation Age of Onset  . Hypertension Mother   . Hypertension Maternal Grandmother      Review of Systems Constitutional: negative for weight loss Gastrointestinal: negative for vomiting Genitourinary:negative for genital lesions and vaginal discharge and dysuria Musculoskeletal:negative for back pain Behavioral/Psych: negative for abusive relationship, depression, illegal drug usage and tobacco use    Objective:    BP 103/65 mmHg  Pulse 84  Temp(Src) 98.7 F (37.1 C)  Wt 131 lb (59.421 kg)  LMP 04/30/2015 General Appearance:    Alert, cooperative, no distress, appears stated age  Head:    Normocephalic, without obvious abnormality, atraumatic  Eyes:    PERRL, conjunctiva/corneas clear, EOM's intact, fundi    benign, both eyes  Ears:    Normal TM's and external ear canals, both ears  Nose:   Nares normal, septum midline, mucosa normal, no drainage    or sinus tenderness  Throat:   Lips, mucosa, and tongue normal; teeth and gums normal  Neck:   Supple,  symmetrical, trachea midline, no adenopathy;    thyroid:  no enlargement/tenderness/nodules; no carotid   bruit or JVD  Back:     Symmetric, no curvature, ROM normal, no CVA tenderness  Lungs:     Clear to auscultation bilaterally, respirations unlabored  Chest Wall:    No tenderness or deformity   Heart:    Regular rate and rhythm, S1 and S2 normal, no murmur, rub   or gallop  Breast Exam:    No tenderness, masses, or nipple abnormality  Abdomen:     Soft, non-tender, bowel sounds active all four quadrants,    no masses, no organomegaly  Genitalia:    Normal female without lesion, discharge or tenderness  Extremities:   Extremities normal, atraumatic, no cyanosis or edema  Pulses:   2+ and symmetric all extremities  Skin:   Skin color, texture, turgor normal, no rashes or lesions  Lymph nodes:   Cervical, supraclavicular, and axillary nodes normal  Neurologic:   CNII-XII intact, normal strength, sensation and reflexes    throughout      Lab Review Urine pregnancy test Labs reviewed yes Radiologic studies reviewed yes Assessment:    Pregnancy at [redacted]w[redacted]d weeks    Plan:      Prenatal vitamins.  Counseling provided regarding continued use of seat belts, cessation of alcohol consumption, smoking or use of illicit drugs; infection precautions i.e., influenza/TDAP immunizations, toxoplasmosis,CMV, parvovirus, listeria and  varicella; workplace safety, exercise during pregnancy; routine dental care, safe medications, sexual activity, hot tubs, saunas, pools, travel, caffeine use, fish and methlymercury, potential toxins, hair treatments, varicose veins Weight gain recommendations per IOM guidelines reviewed: underweight/BMI< 18.5--> gain 28 - 40 lbs; normal weight/BMI 18.5 - 24.9--> gain 25 - 35 lbs; overweight/BMI 25 - 29.9--> gain 15 - 25 lbs; obese/BMI >30->gain  11 - 20 lbs Problem list reviewed and updated. FIRST/CF mutation testing/NIPT/QUAD SCREEN/fragile X/Ashkenazi Jewish population  testing/Spinal muscular atrophy discussed: requested. Role of ultrasound in pregnancy discussed; fetal survey: requested. Amniocentesis discussed: not indicated. VBAC calculator score: VBAC consent form provided No orders of the defined types were placed in this encounter.   Orders Placed This Encounter  Procedures  . Culture, OB Urine  . Obstetric panel  . HIV antibody  . Hemoglobinopathy evaluation  . Varicella zoster antibody, IgG  . Vit D  25 hydroxy (rtn osteoporosis monitoring)  . POCT urinalysis dipstick    Follow up in 4 weeks.

## 2015-07-15 ENCOUNTER — Encounter (HOSPITAL_COMMUNITY): Payer: Self-pay | Admitting: *Deleted

## 2015-07-15 ENCOUNTER — Inpatient Hospital Stay (HOSPITAL_COMMUNITY)
Admission: AD | Admit: 2015-07-15 | Discharge: 2015-07-15 | Disposition: A | Discharge status: Home / Self Care | Payer: Medicaid Other | Source: Ambulatory Visit | Attending: Obstetrics | Admitting: Obstetrics

## 2015-07-15 DIAGNOSIS — N939 Abnormal uterine and vaginal bleeding, unspecified: Secondary | ICD-10-CM | POA: Diagnosis present

## 2015-07-15 DIAGNOSIS — Z3A10 10 weeks gestation of pregnancy: Secondary | ICD-10-CM | POA: Diagnosis not present

## 2015-07-15 DIAGNOSIS — Z8249 Family history of ischemic heart disease and other diseases of the circulatory system: Secondary | ICD-10-CM | POA: Insufficient documentation

## 2015-07-15 DIAGNOSIS — O209 Hemorrhage in early pregnancy, unspecified: Secondary | ICD-10-CM | POA: Insufficient documentation

## 2015-07-15 DIAGNOSIS — O468X1 Other antepartum hemorrhage, first trimester: Secondary | ICD-10-CM

## 2015-07-15 DIAGNOSIS — O418X10 Other specified disorders of amniotic fluid and membranes, first trimester, not applicable or unspecified: Secondary | ICD-10-CM

## 2015-07-15 LAB — OBSTETRIC PANEL
Antibody Screen: NEGATIVE
Basophils Absolute: 0 10*3/uL (ref 0.0–0.1)
Basophils Relative: 0 % (ref 0–1)
EOS PCT: 1 % (ref 0–5)
Eosinophils Absolute: 0.1 10*3/uL (ref 0.0–0.7)
HCT: 35.5 % — ABNORMAL LOW (ref 36.0–46.0)
HEP B S AG: NEGATIVE
Hemoglobin: 12.3 g/dL (ref 12.0–15.0)
LYMPHS ABS: 1.4 10*3/uL (ref 0.7–4.0)
LYMPHS PCT: 15 % (ref 12–46)
MCH: 26.9 pg (ref 26.0–34.0)
MCHC: 34.6 g/dL (ref 30.0–36.0)
MCV: 77.5 fL — AB (ref 78.0–100.0)
MONO ABS: 0.6 10*3/uL (ref 0.1–1.0)
MPV: 9.3 fL (ref 8.6–12.4)
Monocytes Relative: 6 % (ref 3–12)
NEUTROS ABS: 7.5 10*3/uL (ref 1.7–7.7)
Neutrophils Relative %: 78 % — ABNORMAL HIGH (ref 43–77)
PLATELETS: 186 10*3/uL (ref 150–400)
RBC: 4.58 MIL/uL (ref 3.87–5.11)
RDW: 15.6 % — AB (ref 11.5–15.5)
RH TYPE: POSITIVE
RUBELLA: 6.04 {index} — AB (ref <0.90)
WBC: 9.6 10*3/uL (ref 4.0–10.5)

## 2015-07-15 LAB — VARICELLA ZOSTER ANTIBODY, IGG: VARICELLA IGG: 1421 {index} — AB (ref <135.00)

## 2015-07-15 LAB — HIV ANTIBODY (ROUTINE TESTING W REFLEX): HIV: NONREACTIVE

## 2015-07-15 LAB — VITAMIN D 25 HYDROXY (VIT D DEFICIENCY, FRACTURES): VIT D 25 HYDROXY: 26 ng/mL — AB (ref 30–100)

## 2015-07-15 LAB — CULTURE, OB URINE
Colony Count: NO GROWTH
Organism ID, Bacteria: NO GROWTH

## 2015-07-15 NOTE — MAU Note (Signed)
Pt states that she had a gush of blood at 0400 that then stopped after getting up. Pt denies LOF and states that she is still having a scant amount of spotting when wiping in BR.

## 2015-07-15 NOTE — MAU Provider Note (Signed)
History     CSN: [id number]  Arrival date and time: 07/15/15 4098   First Provider Initiated Contact with Patient 07/15/15 [phone]      Chief Complaint  Patient presents with  . Vaginal Bleeding   HPI Comments: Sherri Christensen is a 29 y.o. G4P3003 at [redacted]w[redacted]d who presents today with vaginal bleeding. She states that she awoke at 0400 and had a gush of blood. She states that it has slowed down now. She denies any pain. She states that she was told she had a [facility] at her last visit, and that she would have some vaginal bleeding.   Vaginal Bleeding The patient's primary symptoms include vaginal bleeding. This is a new problem. The current episode started today. The problem occurs intermittently. The problem has been rapidly improving. The patient is experiencing no pain. She is pregnant. Pertinent negatives include no abdominal pain, constipation, diarrhea, dysuria, fever, frequency, nausea, urgency or vomiting. The vaginal discharge was bloody. The vaginal bleeding is typical of menses. She has not been passing clots (one plum sized clot ). She has not been passing tissue. Nothing aggravates the symptoms. She has tried nothing for the symptoms. Sexual activity: no recent intercourse     Past Medical History  Diagnosis Date  . Medical history non-contributory     Past Surgical History  Procedure Laterality Date  . Eye surgery      lasik    Family History  Problem Relation Age of Onset  . Hypertension Mother   . Hypertension Maternal Grandmother     Social History  Substance Use Topics  . Smoking status: Never Smoker   . Smokeless tobacco: Never Used  . Alcohol Use: No    Allergies: No Known Allergies  Prescriptions prior to admission  Medication Sig Dispense Refill Last Dose  . Prenatal Vit-Min-FA-Fish Oil ([facility] PRENATAL GUMMY PO) Take 2 each by mouth daily.   07/14/2015 at Unknown time    Review of Systems  Constitutional: Negative for fever.  Gastrointestinal: Negative for  nausea, vomiting, abdominal pain, diarrhea and constipation.  Genitourinary: Positive for vaginal bleeding. Negative for dysuria, urgency and frequency.   Physical Exam   Blood pressure 102/63, pulse 67, temperature 98.2 F (36.8 C), temperature source Oral, resp. rate 18, height 5' 10.5" (1.791 m), weight 59.421 kg (131 lb), last menstrual period 04/30/2015.  Physical Exam  Nursing note and vitals reviewed. Constitutional: She is oriented to person, place, and time. She appears well-developed and well-nourished. No distress.  HENT:  Head: Normocephalic.  Cardiovascular: Normal rate.   Respiratory: Effort normal.  GI: Soft. There is no tenderness. There is no rebound.  Genitourinary:  Fundus: just above symphysis. FHT 158.  No active bleeding seen now.   Neurological: She is alert and oriented to person, place, and time.  Skin: Skin is warm and dry.  Psychiatric: She has a normal mood and affect.   Results for orders placed or performed in visit on 07/14/15 (from the past 24 hour(s))  POCT urinalysis dipstick     Status: None   Collection Time: 07/14/15  4:20 PM  Result Value Ref Range   Color, UA yellow    Clarity, UA clear    Glucose, UA neg    Bilirubin, UA neg    Ketones, UA neg    Spec Grav, UA 1.010    Blood, UA neg    pH, UA 5.0    Protein, UA neg    Urobilinogen, UA negative    Nitrite, UA  neg    Leukocytes, UA Negative Negative    MAU Course  Procedures  MDM   Assessment and Plan   1. Subchorionic hemorrhage in first trimester    DC home Comfort measures reviewed  2ndTrimester precautions  Bleeding precautions RX: none  Return to MAU as needed FU with OB as planned  Follow-up Information    Follow up with Brock Bad, MD.   Specialty:  Obstetrics and Gynecology   Why:  As scheduled   Contact information:   [address] [place] [address] [phone]         Tawnya Crook 07/15/2015, 5:30 AM

## 2015-07-15 NOTE — MAU Note (Signed)
Coming to assess patient.

## 2015-07-15 NOTE — Discharge Instructions (Signed)

## 2015-07-18 ENCOUNTER — Encounter: Payer: Medicaid Other | Admitting: Obstetrics

## 2015-07-18 LAB — HEMOGLOBINOPATHY EVALUATION
HGB A2 QUANT: 3.8 % — AB (ref 2.2–3.2)
Hemoglobin Other: 0 %
Hgb A: 61.6 % — ABNORMAL LOW (ref 96.8–97.8)
Hgb F Quant: 0 % (ref 0.0–2.0)
Hgb S Quant: 34.6 % — ABNORMAL HIGH

## 2015-08-03 ENCOUNTER — Other Ambulatory Visit: Payer: Self-pay | Admitting: Certified Nurse Midwife

## 2015-08-11 ENCOUNTER — Encounter: Payer: Medicaid Other | Admitting: Obstetrics

## 2015-08-11 ENCOUNTER — Telehealth: Payer: Self-pay | Admitting: Obstetrics

## 2015-08-11 NOTE — Telephone Encounter (Signed)
08/11/2015 - Called patient inreference to missed appointment. Pateient refused to reschedule. brm

## 2016-02-21 ENCOUNTER — Other Ambulatory Visit: Payer: Self-pay | Admitting: Certified Nurse Midwife

## 2016-02-28 ENCOUNTER — Ambulatory Visit (INDEPENDENT_AMBULATORY_CARE_PROVIDER_SITE_OTHER): Payer: Self-pay | Admitting: *Deleted

## 2016-02-28 ENCOUNTER — Encounter: Payer: Self-pay | Admitting: *Deleted

## 2016-02-28 VITALS — BP 124/85 | HR 87 | Wt 127.0 lb

## 2016-02-28 DIAGNOSIS — Z3202 Encounter for pregnancy test, result negative: Secondary | ICD-10-CM

## 2016-02-28 DIAGNOSIS — Z3200 Encounter for pregnancy test, result unknown: Secondary | ICD-10-CM

## 2016-02-28 LAB — POCT URINE PREGNANCY: Preg Test, Ur: NEGATIVE

## 2016-02-29 NOTE — Progress Notes (Signed)
Pt is in office for UPT today.  Pt states that she has had a positive and negative at home test.  Pt states that she could not remember her LMP.  Pt states that she did have some bleeding last month but does not know when.  Pt made aware that UPT in office today is negative. Pt advised to abstain from intercourse until she has a cycle to start.  Pt advised to use condoms with intercourse if she does not abstain.  Pt was advised to call office if she did not have a cycle for the month of April.  Pt made aware that she may return to office for f/u if no cycle to start. Pt states understanding.

## 2016-04-09 ENCOUNTER — Ambulatory Visit: Payer: Medicaid Other | Admitting: Obstetrics

## 2016-08-31 DIAGNOSIS — Z681 Body mass index (BMI) 19 or less, adult: Secondary | ICD-10-CM | POA: Diagnosis not present

## 2016-08-31 DIAGNOSIS — Z01419 Encounter for gynecological examination (general) (routine) without abnormal findings: Secondary | ICD-10-CM | POA: Diagnosis not present

## 2016-08-31 DIAGNOSIS — Z1151 Encounter for screening for human papillomavirus (HPV): Secondary | ICD-10-CM | POA: Diagnosis not present

## 2016-09-18 DIAGNOSIS — Z130 Encounter for screening for diseases of the blood and blood-forming organs and certain disorders involving the immune mechanism: Secondary | ICD-10-CM | POA: Diagnosis not present

## 2016-09-18 DIAGNOSIS — Z13220 Encounter for screening for lipoid disorders: Secondary | ICD-10-CM | POA: Diagnosis not present

## 2016-09-18 DIAGNOSIS — Z0000 Encounter for general adult medical examination without abnormal findings: Secondary | ICD-10-CM | POA: Diagnosis not present

## 2016-09-18 DIAGNOSIS — Z1329 Encounter for screening for other suspected endocrine disorder: Secondary | ICD-10-CM | POA: Diagnosis not present

## 2016-09-18 DIAGNOSIS — Z131 Encounter for screening for diabetes mellitus: Secondary | ICD-10-CM | POA: Diagnosis not present

## 2016-09-24 MED FILL — VIT D2 1.25 MG (50,000 UNIT: 1.25 MG | 84 days supply | Qty: 12 | Fill #0

## 2016-11-16 IMAGING — US US OB TRANSVAGINAL
1 series · 14 of 28 positions shown · non-contrast
Comparison: 06/18/2015

CLINICAL DATA: Pregnant, vaginal bleeding/clots

EXAM:
TRANSVAGINAL OB ULTRASOUND
TECHNIQUE: Transvaginal ultrasound was performed for complete evaluation of the
gestation as well as the maternal uterus, adnexal regions, and
pelvic cul-de-sac.

[Series 1: us ob follow up · 14 of 37 slices shown]
[im 2/37]
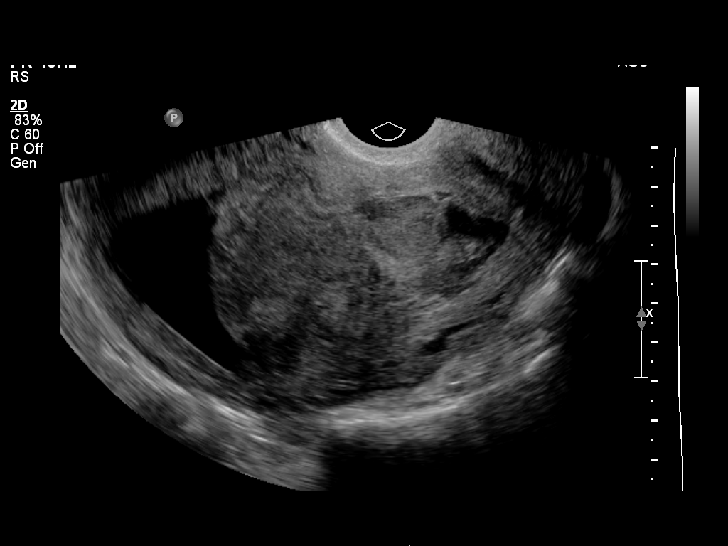
[im 5/37]
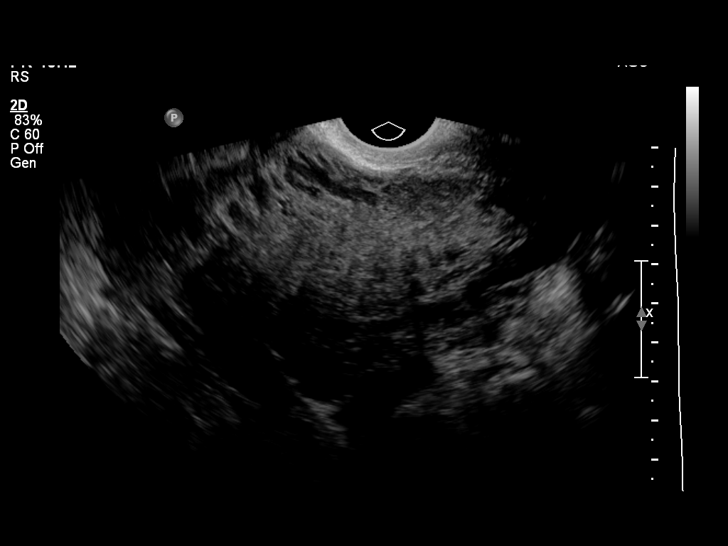
[im 7/37]
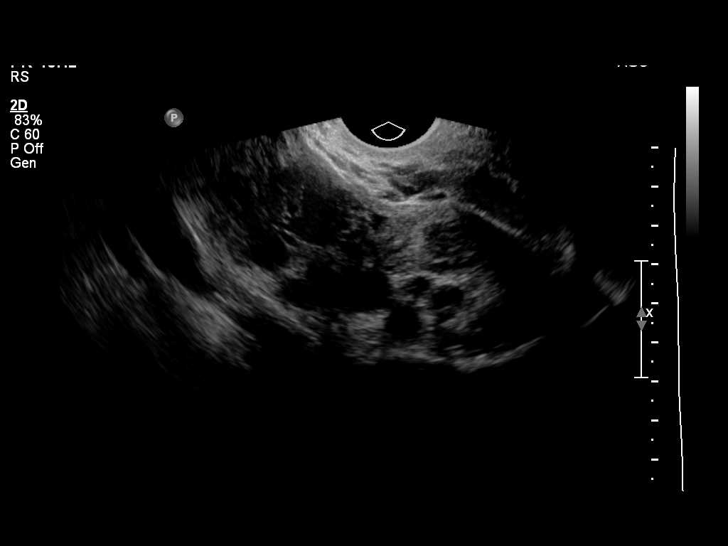
[im 10/37]
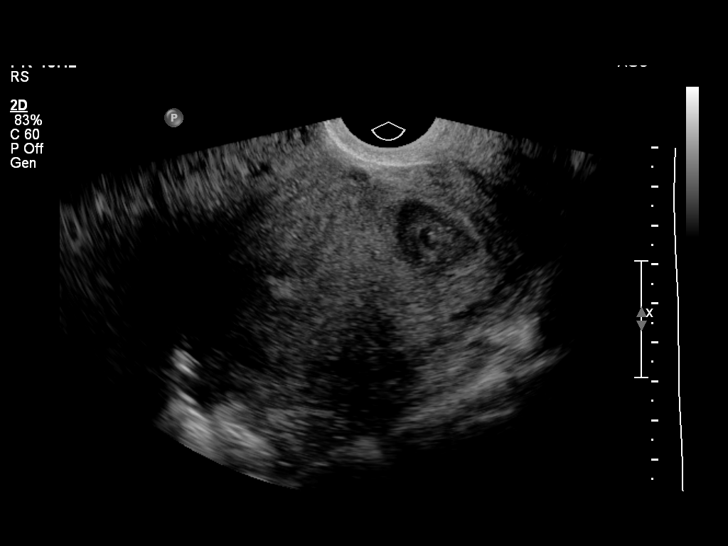
[im 13/37]
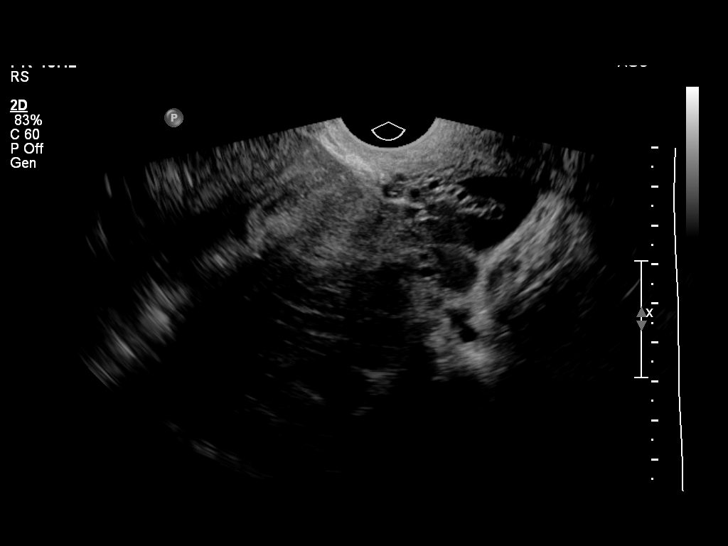
[im 15/37]
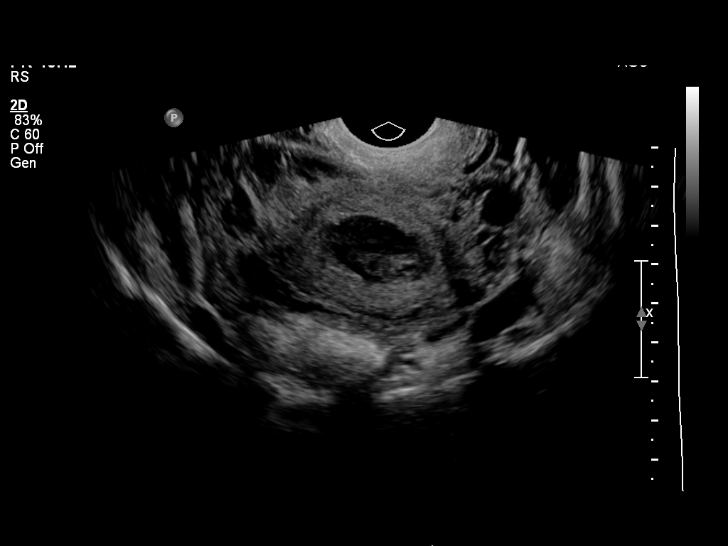
[im 18/37]
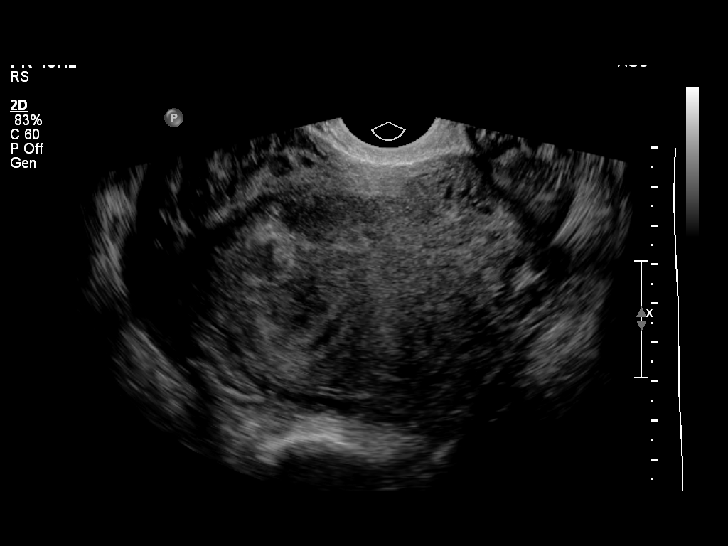
[im 21/37]
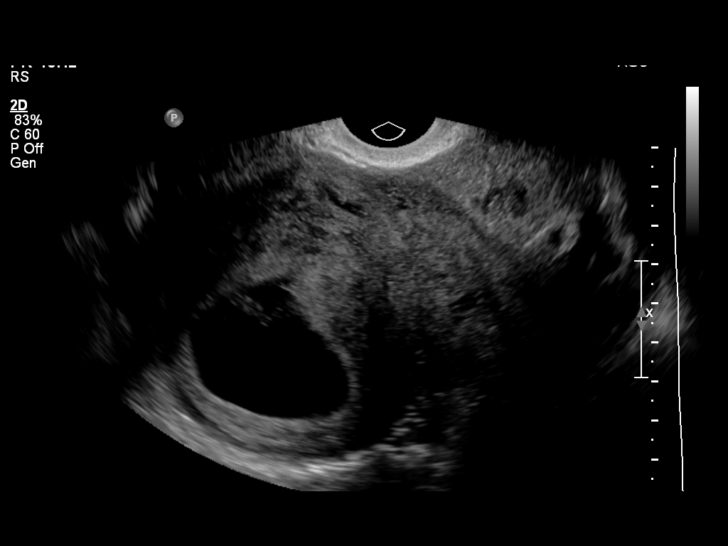
[im 23/37]
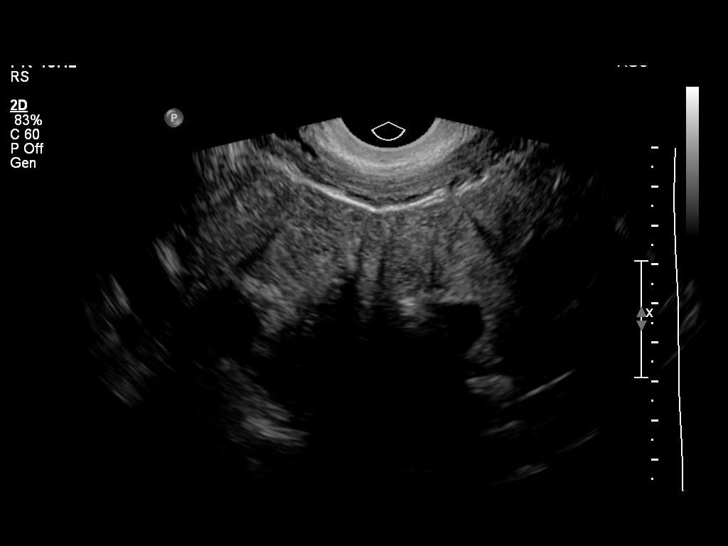
[im 26/37]
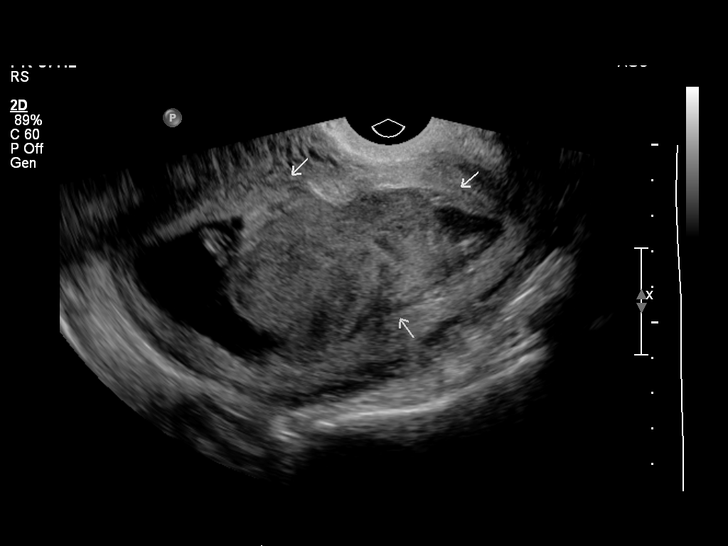
[im 29/37]
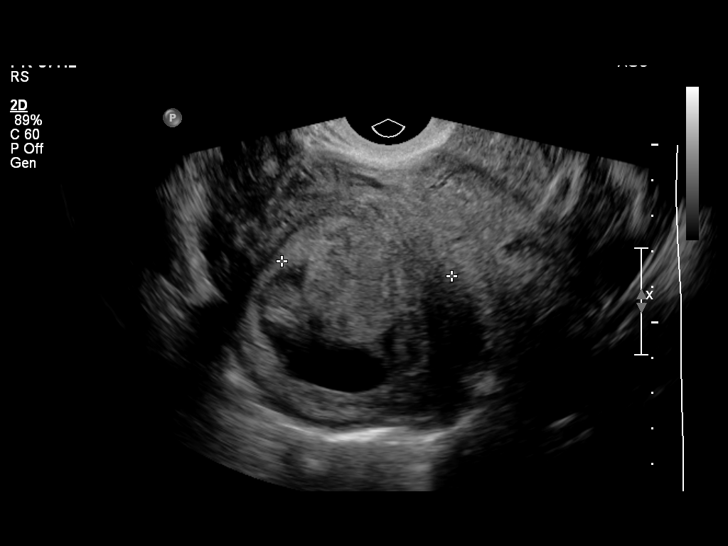
[im 31/37]
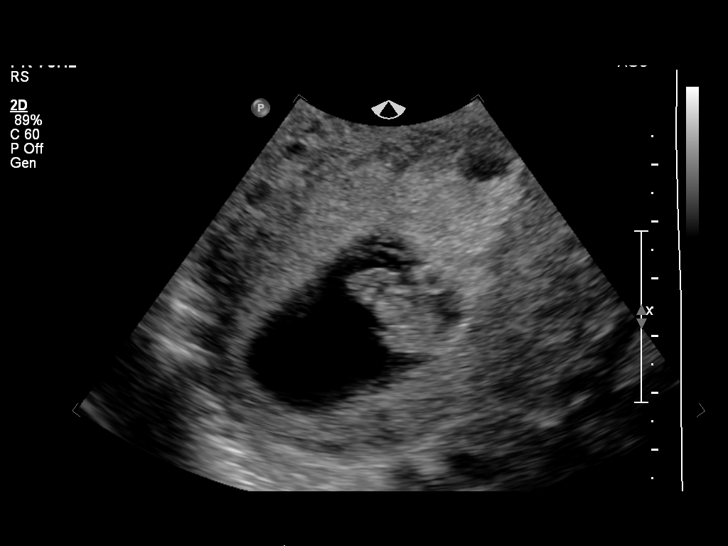
[im 34/37]
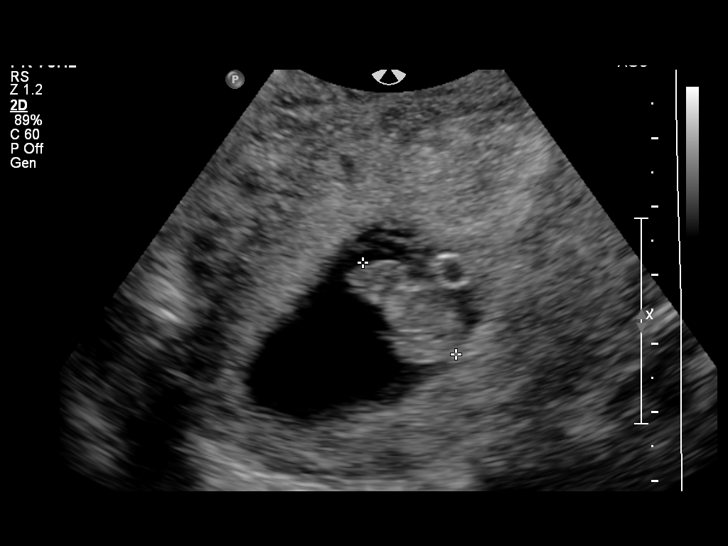
[im 37/37]
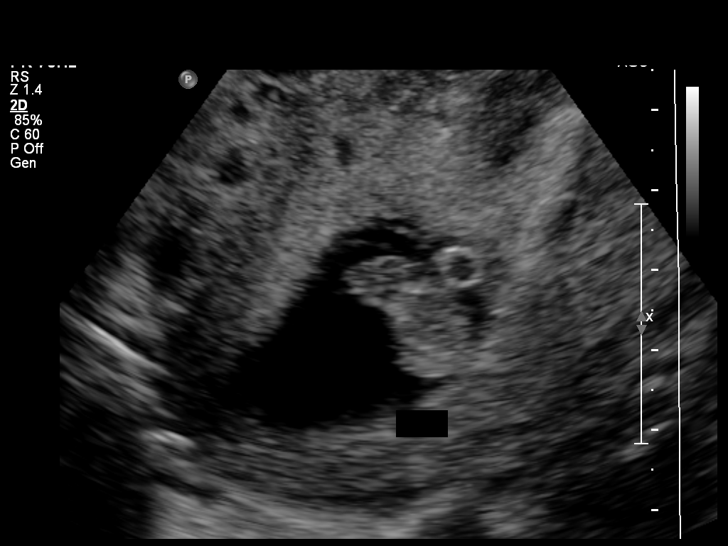

[14 of 28 positions shown; findings below may reference images not displayed]

FINDINGS: Intrauterine gestational sac: Visualized/normal in shape.

Yolk sac:  Present

Embryo:  Present

Cardiac Activity: Present

Heart Rate: 160 bpm

CRL:   19.8  mm   8 w 4 d                  US EDC: 02/06/2015

Maternal uterus/adnexae: Large subchronic hemorrhage, measuring
x 3.6 x 4.8 cm.

Bilateral ovaries are not discretely visualized.

No free fluid.
IMPRESSION: Single live intrauterine gestation with estimated gestational age
measuring 8 weeks 4 days by crown lump length.

Large subchronic hemorrhage.
